# Patient Record
Sex: Male | Born: 1985 | Race: White | Hispanic: No | Marital: Single | Smoking: Current some day smoker
Health system: Southern US, Community
[De-identification: ages and names within clinical notes are randomized; demographics above are authoritative.]

## PROBLEM LIST (undated history)

## (undated) HISTORY — PX: KNEE ARTHROSCOPY: SHX127

---

## 2016-02-11 ENCOUNTER — Encounter: Payer: Self-pay | Admitting: Emergency Medicine

## 2016-02-11 ENCOUNTER — Emergency Department
Admission: EM | Admit: 2016-02-11 | Discharge: 2016-02-12 | Disposition: A | Discharge status: Home / Self Care | Payer: Self-pay | Attending: Emergency Medicine | Admitting: Emergency Medicine

## 2016-02-11 DIAGNOSIS — F4325 Adjustment disorder with mixed disturbance of emotions and conduct: Secondary | ICD-10-CM

## 2016-02-11 DIAGNOSIS — F1290 Cannabis use, unspecified, uncomplicated: Secondary | ICD-10-CM | POA: Insufficient documentation

## 2016-02-11 DIAGNOSIS — F17200 Nicotine dependence, unspecified, uncomplicated: Secondary | ICD-10-CM | POA: Insufficient documentation

## 2016-02-11 DIAGNOSIS — F4329 Adjustment disorder with other symptoms: Secondary | ICD-10-CM | POA: Insufficient documentation

## 2016-02-11 DIAGNOSIS — R45851 Suicidal ideations: Secondary | ICD-10-CM

## 2016-02-11 DIAGNOSIS — F1210 Cannabis abuse, uncomplicated: Secondary | ICD-10-CM

## 2016-02-11 DIAGNOSIS — Z5181 Encounter for therapeutic drug level monitoring: Secondary | ICD-10-CM | POA: Insufficient documentation

## 2016-02-11 DIAGNOSIS — F17290 Nicotine dependence, other tobacco product, uncomplicated: Secondary | ICD-10-CM | POA: Insufficient documentation

## 2016-02-11 DIAGNOSIS — Z046 Encounter for general psychiatric examination, requested by authority: Secondary | ICD-10-CM

## 2016-02-11 LAB — COMPREHENSIVE METABOLIC PANEL
ALT: 26 U/L (ref 17–63)
AST: 28 U/L (ref 15–41)
Albumin: 5 g/dL (ref 3.5–5.0)
Alkaline Phosphatase: 50 U/L (ref 38–126)
Anion gap: 12 (ref 5–15)
BILIRUBIN TOTAL: 0.8 mg/dL (ref 0.3–1.2)
BUN: 18 mg/dL (ref 6–20)
CHLORIDE: 109 mmol/L (ref 101–111)
CO2: 22 mmol/L (ref 22–32)
Calcium: 9.9 mg/dL (ref 8.9–10.3)
Creatinine, Ser: 0.98 mg/dL (ref 0.61–1.24)
GFR calc Af Amer: >60 mL/min (ref >60)
Glucose, Bld: 103 mg/dL — ABNORMAL HIGH (ref 65–99)
POTASSIUM: 3.6 mmol/L (ref 3.5–5.1)
Sodium: 143 mmol/L (ref 135–145)
TOTAL PROTEIN: 8 g/dL (ref 6.5–8.1)

## 2016-02-11 LAB — CBC
HCT: 47.6 % (ref 40.0–52.0)
Hemoglobin: 16.3 g/dL (ref 13.0–18.0)
MCH: 31.5 pg (ref 26.0–34.0)
MCHC: 34.4 g/dL (ref 32.0–36.0)
MCV: 91.5 fL (ref 80.0–100.0)
PLATELETS: 136 10*3/uL — AB (ref 150–440)
RBC: 5.2 MIL/uL (ref 4.40–5.90)
RDW: 12.8 % (ref 11.5–14.5)
WBC: 9.6 10*3/uL (ref 3.8–10.6)

## 2016-02-11 LAB — URINE DRUG SCREEN, QUALITATIVE (ARMC ONLY)
AMPHETAMINES, UR SCREEN: NOT DETECTED
BENZODIAZEPINE, UR SCRN: NOT DETECTED
Barbiturates, Ur Screen: NOT DETECTED
CANNABINOID 50 NG, UR ~~LOC~~: POSITIVE — AB
Cocaine Metabolite,Ur ~~LOC~~: NOT DETECTED
MDMA (ECSTASY) UR SCREEN: NOT DETECTED
Methadone Scn, Ur: NOT DETECTED
Opiate, Ur Screen: NOT DETECTED
PHENCYCLIDINE (PCP) UR S: NOT DETECTED
TRICYCLIC, UR SCREEN: NOT DETECTED

## 2016-02-11 LAB — ETHANOL

## 2016-02-11 LAB — SALICYLATE LEVEL

## 2016-02-11 LAB — ACETAMINOPHEN LEVEL: Acetaminophen (Tylenol), Serum: <10 ug/mL — ABNORMAL LOW (ref 10–30)

## 2016-02-11 MED ORDER — IBUPROFEN 100 MG/5ML PO SUSP: 600.0000 mg | Freq: Once | ORAL | Status: DC

## 2016-02-11 MED ORDER — IBUPROFEN 600 MG PO TABS
ORAL_TABLET | ORAL | Status: AC
  Administered 2016-02-11: 600 mg via ORAL
  Filled 2016-02-11: qty 1

## 2016-02-11 MED ORDER — DIPHENHYDRAMINE HCL 25 MG PO CAPS
50.0000 mg | ORAL_CAPSULE | Freq: Once | ORAL | Status: AC
  Administered 2016-02-11: 50 mg via ORAL
  Filled 2016-02-11: qty 2

## 2016-02-11 MED ORDER — IBUPROFEN 600 MG PO TABS
600.0000 mg | ORAL_TABLET | Freq: Once | ORAL | Status: AC
  Administered 2016-02-11: 600 mg via ORAL

## 2016-02-11 NOTE — ED Notes (Signed)
MD at bedside. 

## 2016-02-11 NOTE — ED Notes (Signed)
Pt moved to room 25 and SOC setup at bedside.

## 2016-02-11 NOTE — ED Provider Notes (Addendum)
[facility] Emergency Department Provider Note   ____________________________________________   I have reviewed the triage vital signs and the nursing notes.   HISTORY  Chief Complaint Psychiatric Evaluation   History limited by: Not Limited   HPI Fernando Phelps is a 30 y.o. male who presents to the emergency department under IVC paperwork. Per IVC paperwork the patient made comments concerning for suicidal ideation on social media. He did admit to this. He states he had a very hard day. He said that he wrote those to get some attention. He denies any current thoughts of SI or history of depression.   History reviewed. No pertinent past medical history.  There are no active problems to display for this patient.   History reviewed. No pertinent surgical history.  Prior to Admission medications   Not on File    Allergies Review of patient's allergies indicates no known allergies.  History reviewed. No pertinent family history.  Social History Social History  Substance Use Topics  . Smoking status: Current Some Day Smoker  . Smokeless tobacco: Current User  . Alcohol use Yes    Review of Systems  Constitutional: Negative for fever. Cardiovascular: Negative for chest pain. Respiratory: Negative for shortness of breath. Gastrointestinal: Negative for abdominal pain, vomiting and diarrhea. Neurological: Negative for headaches, focal weakness or numbness.  10-point ROS otherwise negative.  ____________________________________________   PHYSICAL EXAM:  VITAL SIGNS: ED Triage Vitals [02/11/16 1839]  Enc Vitals Group     BP (!) 136/96     Pulse Rate 80     Resp 18     Temp 98.9 F (37.2 C)     Temp Source Oral     SpO2 97 %     Weight 235 lb (106.6 kg)     Height  (1.93 m)   Constitutional: Alert and oriented. Well appearing and in no distress. Eyes: Conjunctivae are normal. PERRL. Normal extraocular  movements. ENT   Head: Normocephalic and atraumatic.   Nose: No congestion/rhinnorhea.   Mouth/Throat: Mucous membranes are moist.   Neck: No stridor. Hematological/Lymphatic/Immunilogical: No cervical lymphadenopathy. Cardiovascular: Normal rate, regular rhythm.  No murmurs, rubs, or gallops. Respiratory: Normal respiratory effort without tachypnea nor retractions. Breath sounds are clear and equal bilaterally. No wheezes/rales/rhonchi. Gastrointestinal: Soft and nontender. No distention. There is no CVA tenderness. Genitourinary: Deferred Musculoskeletal: Normal range of motion in all extremities. No joint effusions.  No lower extremity tenderness nor edema. Neurologic:  Normal speech and language. No gross focal neurologic deficits are appreciated.  Skin:  Skin is warm, dry and intact. No rash noted. Psychiatric: Mood and affect are normal. Speech and behavior are normal. Patient exhibits appropriate insight and judgment.  ____________________________________________    LABS (pertinent positives/negatives)  Labs Reviewed  COMPREHENSIVE METABOLIC PANEL - Abnormal; Notable for the following:       Result Value   Glucose, Bld 103 (*)    All other components within normal limits  ACETAMINOPHEN LEVEL - Abnormal; Notable for the following:    Acetaminophen (Tylenol), Serum <10 (*)    All other components within normal limits  CBC - Abnormal; Notable for the following:    Platelets 136 (*)    All other components within normal limits  URINE DRUG SCREEN, QUALITATIVE ([facility] ONLY) - Abnormal; Notable for the following:    Cannabinoid 50 Ng, Ur [place] POSITIVE (*)    All other components within normal limits  ETHANOL  SALICYLATE LEVEL     ____________________________________________  EKG  None  ____________________________________________     RADIOLOGY  None  ____________________________________________   PROCEDURES  Procedures  ____________________________________________   INITIAL IMPRESSION / ASSESSMENT AND PLAN / ED COURSE  Pertinent labs & imaging results that were available during my care of the patient were reviewed by me and considered in my medical decision making (see chart for details).  Patient presented to the emergency department today under IVC. Patient did make some concerning comments on social media. Did say he had a bad day today. He did deny any current SI to myself. However given concerning comments that he made will have patient be seen by specialist on-call psychiatrist  Clinical Course   [facility] evaluated patient and recommend inpatient admission. Discussed this with patient. Do not think patient requires one to one sitter at this point. q15 minute checks sufficient.  ____________________________________________   FINAL CLINICAL IMPRESSION(S) / ED DIAGNOSES  Suicidal ideation  Note: This dictation was prepared with Dragon dictation. Any transcriptional errors that result from this process are unintentional    Phineas Semen, MD 02/11/16 6789    Phineas Semen, MD 02/11/16 [phone]

## 2016-02-11 NOTE — ED Triage Notes (Signed)
PT arrived with sheriff. Pt IVC for threatening suicide. Made threat on facebook.  Pt states " I told people if they find me don't bury me find me somewhere beautiful".  Pt states "not really, I was upset, frustrated, and angry" when asked if he plans on hurting himself. Pt walked and found a church and a pastor approached him because he looked upset and talked with him for 2 hours.  At this time denies SI/HI and hallucinations.  Pt reports he was just upset at the situation he put himself in earlier.

## 2016-02-12 DIAGNOSIS — F4325 Adjustment disorder with mixed disturbance of emotions and conduct: Secondary | ICD-10-CM

## 2016-02-12 DIAGNOSIS — Z046 Encounter for general psychiatric examination, requested by authority: Secondary | ICD-10-CM

## 2016-02-12 DIAGNOSIS — R45851 Suicidal ideations: Secondary | ICD-10-CM

## 2016-02-12 DIAGNOSIS — F1210 Cannabis abuse, uncomplicated: Secondary | ICD-10-CM

## 2016-02-12 MED ORDER — NICOTINE 14 MG/24HR TD PT24
MEDICATED_PATCH | TRANSDERMAL | Status: AC
  Administered 2016-02-12: 14 mg via TRANSDERMAL
  Filled 2016-02-12: qty 1

## 2016-02-12 MED ORDER — NICOTINE 14 MG/24HR TD PT24
14.0000 mg | MEDICATED_PATCH | Freq: Every day | TRANSDERMAL | Status: DC
  Administered 2016-02-12 (×2): 14 mg via TRANSDERMAL
  Filled 2016-02-12: qty 1

## 2016-02-12 NOTE — ED Notes (Signed)
BEHAVIORAL HEALTH ROUNDING Patient sleeping: Yes.   Patient alert and oriented: not applicable SLEEPING Behavior appropriate: Yes.  ; If no, describe: SLEEPING Nutrition and fluids offered: No SLEEPING Toileting and hygiene offered: NoSLEEPING Sitter present: not applicable, Q 15 min safety rounds and observation via security camera. Law enforcement present: Yes ODS 

## 2016-02-12 NOTE — ED Notes (Signed)

## 2016-02-12 NOTE — Consult Note (Signed)
[facility] Consult   Reason for Consult:  Consult for 30 year old man with no identified past psychiatric history brought in under involuntary commitment Referring Physician:  Edd Fabian Patient Identification: Fernando Phelps MRN:  [id number] Principal Diagnosis: Adjustment disorder with mixed disturbance of emotions and conduct Diagnosis:   Patient Active Problem List   Diagnosis Date Noted  . Adjustment disorder with mixed disturbance of emotions and conduct [F43.25] 02/12/2016  . Suicidal ideation [R45.851] 02/12/2016  . Involuntary commitment [Z04.6] 02/12/2016  . Marijuana abuse [F12.10] 02/12/2016    Total Time spent with patient: 1 hour  Subjective:   Densil Ottey is a 30 y.o. male patient admitted with "I said something stupid that I shouldn't upset".  HPI:  Patient interviewed. Chart reviewed. Labs and vitals reviewed. 30 year old man brought here under involuntary commitment. Patient was involved with a verbal altercation with his fiance yesterday that was the result of some recent infidelity on his part. This happened early afternoon at his home. After his fianc had gone to work the patient says that he continued to feel very bad about himself. He admits that he posted on Facebook a message that implied that he might kill himself. It said something along the lines of how he was going to say goodbye to this world etc. Patient says that at that time he had no actual intention or plan of harming himself. After posting this he says he went for a long walk. Along the way his cell phone died. At some point later in the afternoon police found him walking and had him brought into the hospital because by that time they had been called by his fianc. Patient says that he was just feeling very upset yesterday. He admits that he was involved in a stupid situation where he cheated on his fianc by text message. He is afraid that his fianc might permanently be breaking  up with him. He says he was feeling very bad about himself and he thinks he was just looking for sympathy. He says prior to yesterday his mood had been good. There is no report of any recent depression. Sleep and appetite had been fine. Patient completely denies them being any thought of suicide at any point. Denies any homicidal ideation. Denies any psychotic symptoms. Recent stress includes the obvious problem with his fianc and also the fact that he is currently out of work. 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 He does not receive any kind of mental health or psychiatric care. Patient says he drinks very little and had not been drinking yesterday. He denied other drug abuse although his drug screen is positive for cannabis.  Social history: Patient lives with his fiance and his own 31-year-old daughter. The child's mother lives back in [place] state. 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 He and his fiance had been together for years he is afraid that she may break up with him now.  Medical history: Denies any ongoing medical problems. No history of high blood pressure diabetes or other significant medical problems.  Substance abuse history: He states that he tends to avoid alcohol because of a family history of alcohol abuse. Was not drinking yesterday. He denied substance use to me although his drug screen is positive for marijuana.  Past Psychiatric History: Denies any past psychiatric treatment as an adult. He says that he was treated with Ritalin as a child but has taken no  psychiatric medicines as an adult. Never been in a psychiatric hospital. Denies any history of suicide attempts or violence.  Risk to Self: Suicidal Ideation: No Suicidal Intent: No Is patient at risk for suicide?: No Suicidal Plan?: No Access to Means: No What has been your use of drugs/alcohol within the last 12 months?: Cannabis & Alcohol How many times?:  0 Other Self Harm Risks: Reports of none Triggers for Past Attempts: None known Intentional Self Injurious Behavior: None Risk to Others: Homicidal Ideation: No Thoughts of Harm to Others: No Current Homicidal Intent: No Current Homicidal Plan: No Access to Homicidal Means: No Identified Victim: Reports of none History of harm to others?: No Assessment of Violence: None Noted Violent Behavior Description: Reports of none Does patient have access to weapons?: No Criminal Charges Pending?: No Does patient have a court date: No Prior Inpatient Therapy: Prior Inpatient Therapy: No Prior Therapy Dates: Reports of none Prior Therapy Facilty/Provider(s): Reports of none Reason for Treatment: Reports of none Prior Outpatient Therapy: Prior Outpatient Therapy: No Prior Therapy Dates: Reports of none Prior Therapy Facilty/Provider(s): Reports of none Reason for Treatment: Reports of none Does patient have an ACCT team?: No Does patient have Intensive In-House Services?  : No Does patient have [facility] services? : No Does patient have P4CC services?: No  Past Medical History: History reviewed. No pertinent past medical history. History reviewed. No pertinent surgical history. Family History: History reviewed. No pertinent family history. Family Psychiatric  History: He reports that his father and several other members of his family have substance abuse problems but he denies knowing of any other mental health problems in the family. Social History:  History  Alcohol Use  . Yes     History  Drug Use  . Types: Marijuana    Social History   Social History  . Marital status: Single    Spouse name: N/A  . Number of children: N/A  . Years of education: N/A   Social History Main Topics  . Smoking status: Current Some Day Smoker  . Smokeless tobacco: Current User  . Alcohol use Yes  . Drug use:     Types: Marijuana  . Sexual activity: Not Asked   Other Topics Concern  . None    Social History Narrative  . None   Additional Social History:    Allergies:  No Known Allergies  Labs:  Results for orders placed or performed during the hospital encounter of 02/11/16 (from the past 48 hour(s))  Comprehensive metabolic panel     Status: Abnormal   Collection Time: 02/11/16  6:43 PM  Result Value Ref Range   Sodium 143 135 - 145 mmol/L   Potassium 3.6 3.5 - 5.1 mmol/L   Chloride 109 101 - 111 mmol/L   CO2 22 22 - 32 mmol/L   Glucose, Bld 103 (H) 65 - 99 mg/dL   BUN 18 6 - 20 mg/dL   Creatinine, Ser 0.98 0.61 - 1.24 mg/dL   Calcium 9.9 8.9 - 10.3 mg/dL   Total Protein 8.0 6.5 - 8.1 g/dL   Albumin 5.0 3.5 - 5.0 g/dL   AST 28 15 - 41 U/L   ALT 26 17 - 63 U/L   Alkaline Phosphatase 50 38 - 126 U/L   Total Bilirubin 0.8 0.3 - 1.2 mg/dL   GFR calc non Af Amer >60 >60 mL/min   GFR calc Af Amer >60 >60 mL/min    Comment: (NOTE) The eGFR has been calculated using the  CKD EPI equation. This calculation has not been validated in all clinical situations. eGFR's persistently <60 mL/min signify possible Chronic Kidney Disease.    Anion gap 12 5 - 15  Ethanol     Status: None   Collection Time: 02/11/16  6:43 PM  Result Value Ref Range   Alcohol, Ethyl (B) <5 <5 mg/dL    Comment:        LOWEST DETECTABLE LIMIT FOR SERUM ALCOHOL IS 5 mg/dL FOR MEDICAL PURPOSES ONLY   Salicylate level     Status: None   Collection Time: 02/11/16  6:43 PM  Result Value Ref Range   Salicylate Lvl <4.4 2.8 - 30.0 mg/dL  Acetaminophen level     Status: Abnormal   Collection Time: 02/11/16  6:43 PM  Result Value Ref Range   Acetaminophen (Tylenol), Serum <10 (L) 10 - 30 ug/mL    Comment:        THERAPEUTIC CONCENTRATIONS VARY SIGNIFICANTLY. A RANGE OF 10-30 ug/mL MAY BE AN EFFECTIVE CONCENTRATION FOR MANY PATIENTS. HOWEVER, SOME ARE BEST TREATED AT CONCENTRATIONS OUTSIDE THIS RANGE. ACETAMINOPHEN CONCENTRATIONS >150 ug/mL AT 4 HOURS AFTER INGESTION AND >50 ug/mL AT  12 HOURS AFTER INGESTION ARE OFTEN ASSOCIATED WITH TOXIC REACTIONS.   cbc     Status: Abnormal   Collection Time: 02/11/16  6:43 PM  Result Value Ref Range   WBC 9.6 3.8 - 10.6 K/uL   RBC 5.20 4.40 - 5.90 MIL/uL   Hemoglobin 16.3 13.0 - 18.0 g/dL   HCT 47.6 40.0 - 52.0 %   MCV 91.5 80.0 - 100.0 fL   MCH 31.5 26.0 - 34.0 pg   MCHC 34.4 32.0 - 36.0 g/dL   RDW 12.8 11.5 - 14.5 %   Platelets 136 (L) 150 - 440 K/uL  Urine Drug Screen, Qualitative     Status: Abnormal   Collection Time: 02/11/16  7:19 PM  Result Value Ref Range   Tricyclic, Ur Screen NONE DETECTED NONE DETECTED   Amphetamines, Ur Screen NONE DETECTED NONE DETECTED   MDMA (Ecstasy)Ur Screen NONE DETECTED NONE DETECTED   Cocaine Metabolite,Ur [place] NONE DETECTED NONE DETECTED   Opiate, Ur Screen NONE DETECTED NONE DETECTED   Phencyclidine (PCP) Ur S NONE DETECTED NONE DETECTED   Cannabinoid 50 Ng, Ur [place] POSITIVE (A) NONE DETECTED   Barbiturates, Ur Screen NONE DETECTED NONE DETECTED   Benzodiazepine, Ur Scrn NONE DETECTED NONE DETECTED   Methadone Scn, Ur NONE DETECTED NONE DETECTED    Comment: (NOTE) 315  Tricyclics, urine               Cutoff 1000 ng/mL 200  Amphetamines, urine             Cutoff 1000 ng/mL 300  MDMA (Ecstasy), urine           Cutoff 500 ng/mL 400  Cocaine Metabolite, urine       Cutoff 300 ng/mL 500  Opiate, urine                   Cutoff 300 ng/mL 600  Phencyclidine (PCP), urine      Cutoff 25 ng/mL 700  Cannabinoid, urine              Cutoff 50 ng/mL 800  Barbiturates, urine             Cutoff 200 ng/mL 900  Benzodiazepine, urine           Cutoff 200 ng/mL 1000 Methadone, urine  Cutoff 300 ng/mL 1100 1200 The urine drug screen provides only a preliminary, unconfirmed 1300 analytical test result and should not be used for non-medical 1400 purposes. Clinical consideration and professional judgment should 1500 be applied to any positive drug screen result due to possible 1600  interfering substances. A more specific alternate chemical method 1700 must be used in order to obtain a confirmed analytical result.  1800 Gas chromato graphy / mass spectrometry (GC/MS) is the preferred 1900 confirmatory method.     Current Facility-Administered Medications  Medication Dose Route Frequency Provider Last Rate Last Dose  . nicotine (NICODERM CQ - dosed in mg/24 hours) patch 14 mg  14 mg Transdermal Daily Paulette Blanch, MD   14 mg at 02/12/16 9767   No current outpatient prescriptions on file.    Musculoskeletal: Strength & Muscle Tone: within normal limits Gait & Station: normal Patient leans: N/A  Psychiatric Specialty Exam: Physical Exam  Nursing note and vitals reviewed. Constitutional: He appears well-developed and well-nourished.  HENT:  Head: Normocephalic and atraumatic.  Eyes: Conjunctivae are normal. Pupils are equal, round, and reactive to light.  Neck: Normal range of motion.  Cardiovascular: Regular rhythm and normal heart sounds.   Respiratory: Effort normal. No respiratory distress.  GI: Soft.  Musculoskeletal: Normal range of motion.  Neurological: He is alert.  Skin: Skin is warm and dry.  Psychiatric: He has a normal mood and affect. His behavior is normal. Judgment and thought content normal.    Review of Systems  Constitutional: Negative.   HENT: Negative.   Eyes: Negative.   Respiratory: Negative.   Cardiovascular: Negative.   Gastrointestinal: Negative.   Musculoskeletal: Negative.   Skin: Negative.   Neurological: Negative.   Psychiatric/Behavioral: Negative for depression, hallucinations, memory loss, substance abuse and suicidal ideas. The patient is nervous/anxious. The patient does not have insomnia.     Blood pressure (!) 136/96, pulse 80, temperature 98.9 F (37.2 C), temperature source Oral, resp. rate 18, height 6' 4"  (1.93 m), weight 106.6 kg (235 lb), SpO2 97 %.Body mass index is 28.61 kg/m.  General Appearance: Casual   Eye Contact:  Good  Speech:  Clear and Coherent  Volume:  Normal  Mood:  Euthymic  Affect:  Appropriate  Thought Process:  Coherent  Orientation:  Full (Time, Place, and Person)  Thought Content:  Logical  Suicidal Thoughts:  No  Homicidal Thoughts:  No  Memory:  Immediate;   Good Recent;   Good Remote;   Good  Judgement:  Fair  Insight:  Fair  Psychomotor Activity:  Normal  Concentration:  Concentration: Fair  Recall:  [employer] of Knowledge:  Fair  Language:  Fair  Akathisia:  No  Handed:  Right  AIMS (if indicated):     Assets:  Communication Skills Desire for Improvement Physical Health Resilience  ADL's:  Intact  Cognition:  WNL  Sleep:        Treatment Plan Summary: Plan 30 year old man brought in under commitment because of an impulsive suicidal statement. No evidence that he had acted on it. Patient is denying any symptoms of depression. Does not appear to be psychotic. He shows appropriate regret for his situation. He has positive plans for the future. Does not appear to meet commitment criteria or require inpatient psychiatric treatment. Counseling has been done with the patient and he has been encouraged to consider going to therapy if mood problems persist. Discontinue involuntary commitment. Patient can be released from the emergency room. Case discussed  with emergency room physician and TTS.  Disposition: Patient does not meet criteria for psychiatric inpatient admission. Supportive therapy provided about ongoing stressors.  Alethia Berthold, MD 02/12/2016 11:47 AM

## 2016-02-12 NOTE — ED Notes (Signed)
Patient continues to endorse frustration with confinement to  ED. He is asking several staff members about when he will see MD so he can go home. He was reminded that staff did not have control over physician schedule; and need to be in the ED was a result of his expressing SI over a social media site.  Maintained on 15 minute checks and observation by security camera for safety.

## 2016-02-12 NOTE — ED Notes (Signed)
ENVIRONMENTAL ASSESSMENT  Potentially harmful objects out of patient reach: Yes.  Personal belongings secured: Yes.  Patient dressed in hospital provided attire only: Yes.  Plastic bags out of patient reach: Yes.  Patient care equipment (cords, cables, call bells, lines, and drains) shortened, removed, or accounted for: Yes.  Equipment and supplies removed from bottom of stretcher: Yes.  Potentially toxic materials out of patient reach: Yes.  Sharps container removed or out of patient reach: Yes.   BEHAVIORAL HEALTH ROUNDING  Patient sleeping: No.  Patient alert and oriented: yes  Behavior appropriate: Yes. ; If no, describe:  Nutrition and fluids offered: Yes  Toileting and hygiene offered: Yes  Sitter present: not applicable, Q 15 min safety rounds and observation via security camera. Law enforcement present: Yes ODS  Pt brought into ED BHU via sally port and wand with metal detector for safety by ODS officer. Patient oriented to unit/care area: Pt informed of unit policies and procedures.  Informed that, for their safety, care areas are designed for safety and monitored by security cameras at all times; and visiting hours explained to patient. Patient verbalizes understanding. Pt shown to their room.

## 2016-02-12 NOTE — ED Notes (Signed)
Patient awake, alert, and oriented. He denies SI or HI. Patient is frustrated with length of stay in the ED. He states he is safe to be discharged.  RN educated patient about need to have full psychiatric assessment in order to have IVC rescinded and discharge. Maintained on 15 minute checks and observation by security camera for safety.

## 2016-02-12 NOTE — ED Provider Notes (Signed)
-----------------------------------------   6:09 AM on 02/12/2016 -----------------------------------------   Blood pressure (!) 136/96, pulse 80, temperature 98.9 F (37.2 C), temperature source Oral, resp. rate 18, height 6\' 4"  (1.93 m), weight 235 lb (106.6 kg), SpO2 97 %.  The patient had no acute events since last update.  Calm and cooperative at this time.  Disposition is pending per Psychiatry/Behavioral Medicine team recommendations.     Irean Hong, MD 02/12/16 [phone]

## 2016-02-12 NOTE — ED Provider Notes (Signed)
-----------------------------------------   1:03 PM on 02/12/2016 -----------------------------------------  Dr. Toni Amend has evaluated the patient, rescinded IVC, recommends discharge with outpatient resources. DC home with return precautions.   Gayla Doss, MD 02/12/16 1304

## 2016-02-12 NOTE — ED Notes (Signed)
Patient in dayroom. Discharge disposition in progress.Maintained on 15 minute checks and observation by security camera for safety.

## 2016-02-12 NOTE — ED Notes (Signed)
Patient meeting with the psychiatrist. 

## 2016-02-12 NOTE — ED Notes (Signed)
Patient discharged ambulatory to self. He denies SI or HI. Discharge instructions reviewed with patient, he verbalizes understanding. Patient received all personal belongings. 

## 2016-02-12 NOTE — BH Assessment (Signed)
Assessment Note  Fernando Phelps is an 30 y.o. male who presents to the ER, via Law Enforcement due to a post he put on FaceBook, which suggest he was going to end his life. Patient denies past and current SI, gestures an attempts. He states he put the post on FaceBook because he was frustrated an overwhelmed. He used his phone to put it up there and had only "2 to 3 percent of his battery left." It resulted in no one being able to contact him for approximately two hours.  Per the report of the patient, he and his fianc are having problems in their relationship. Patient admits to sleeping with another woman, this past March. He was in [facility], for [employer] for his daughter. While there the event took place. Another lady wanted to sleep with him and he turned her down. The lady made statements about his genitals. He responded by sending a picture of his penis. On today, the young lady, who he didn't sleep with, forwarded the picture to his fianc an informed her he slept with someone else. Patient had went back to [facility] again for court. When he was boarding the plane to return back to [facility], fianc called him and confront him about it.  When he landed in [place], the argument continued. She voiced her frustration with him and stated the relationship was over. She also told him, he had to get out. Patient is living with the fianc and her father. Patient also have his daughter with him and she's living in the same home. This is what led to him being frustrated an overwhelmed.  He states the post was "more like poetry. I wasn't going to kill myself. But I guess it didn't help, I went missing for two hours." Per his report, the post was about how life was a boulder and rolling down a hill. Trees stop it every now and again but it keep rolling. When it gets to the bottom, "don't burry my body but spread my ashes everywhere." After sending the post, he "went walking  in the woods to clear my head." He ended up at a nearby church. He talked with the pastor for approximately two hours. During that time his family and friends were looking for him. After talking with the pastor, he stopped by [employer] and got something to eat. While walking home, law enforcement seen him an asked him to get in the car, because papers were filled for him to be committed.  During the time, the patient been in the ER, he has denied SI/HI and AV/H. Outside of the custody case with his daughter, he have no involvement with the legal system. During the interview he was calm, pleasant and cooperative. He voice his frustration about being in the ER, because he's afraid he is going to miss work. "I lost three jobs because I had to fly back and forwarded to [place]." The mother of the patient's daughter is abusing drugs and he's working on getting full custody of the child. The child is currently with the maternal grandmother. She moved to [place] to be close to the granddaughter. Patient and his daughter been moved to [facility] January 2017.   Past Medical History: History reviewed. No pertinent past medical history.  History reviewed. No pertinent surgical history.  Family History: History reviewed. No pertinent family history.  Social History:  reports that he has been smoking.  He uses smokeless tobacco. He reports  that he drinks alcohol. He reports that he uses drugs, including Marijuana.  Additional Social History:  Alcohol / Drug Use Pain Medications: See PTA Prescriptions: See PTA Over the Counter: See PTA  History of alcohol / drug use?: Yes Longest period of sobriety (when/how long): Reports of none Negative Consequences of Use:  (Reports of none) Withdrawal Symptoms:  (Reports of none) Substance #1 Name of Substance 1: Alcohol 1 - Frequency: 2 to 3 days a week 1 - Last Use / Amount: Unknown Substance #2 Name of Substance 2: Cannabis 2 - Frequency: 3 to 4 days a  week 2 - Last Use / Amount: Unknown  CIWA: CIWA-Ar BP: (!) 136/96 Pulse Rate: 80 COWS:    Allergies: No Known Allergies  Home Medications:  (Not in a hospital admission)  OB/GYN Status:  No LMP for male patient.  General Assessment Data Location of Assessment: [facility] ED TTS Assessment: In system Is this a Tele or Face-to-Face Assessment?: Face-to-Face Is this an Initial Assessment or a Re-assessment for this encounter?: Initial Assessment Marital status: Long term relationship Maiden name: n/a Is patient pregnant?: No Pregnancy Status: No Living Arrangements: Spouse/significant other Can pt return to current living arrangement?: No Admission Status: Involuntary Is patient capable of signing voluntary admission?: No Referral Source: Self/Family/Friend Insurance type: n/a  Medical Screening Exam [facility] Walk-in ONLY) Medical Exam completed: Yes  Crisis Care Plan Living Arrangements: Spouse/significant other Legal Guardian:  (Reports of none) Name of Psychiatrist: Reports of none Name of Therapist: Reports of none  Education Status Is patient currently in school?: No Current Grade: n/a Highest grade of school patient has completed: [employer]ocational School Name of school: n/a Contact person: n/a  Risk to self with the past 6 months Suicidal Ideation: No Has patient been a risk to self within the past 6 months prior to admission? : No Suicidal Intent: No Has patient had any suicidal intent within the past 6 months prior to admission? : No Is patient at risk for suicide?: No Suicidal Plan?: No Has patient had any suicidal plan within the past 6 months prior to admission? : No Access to Means: No What has been your use of drugs/alcohol within the last 12 months?: Cannabis & Alcohol Previous Attempts/Gestures: No How many times?: 0 Other Self Harm Risks: Reports of none Triggers for Past Attempts: None known Intentional Self Injurious Behavior: None Family Suicide History:  No Recent stressful life event(s):  (Reports of none) Persecutory voices/beliefs?: No Depression: Yes Depression Symptoms: Guilt Substance abuse history and/or treatment for substance abuse?: Yes Suicide prevention information given to non-admitted patients: Not applicable  Risk to Others within the past 6 months Homicidal Ideation: No Does patient have any lifetime risk of violence toward others beyond the six months prior to admission? : No Thoughts of Harm to Others: No Current Homicidal Intent: No Current Homicidal Plan: No Access to Homicidal Means: No Identified Victim: Reports of none History of harm to others?: No Assessment of Violence: None Noted Violent Behavior Description: Reports of none Does patient have access to weapons?: No Criminal Charges Pending?: No Does patient have a court date: No Is patient on probation?: No  Psychosis Hallucinations: None noted Delusions: None noted  Mental Status Report Appearance/Hygiene: In hospital gown, In scrubs, Unremarkable Eye Contact: Good Motor Activity: Freedom of movement, Unremarkable Speech: Logical/coherent, Unremarkable Level of Consciousness: Alert Mood: Anxious, Pleasant Affect: Appropriate to circumstance Anxiety Level: Minimal Thought Processes: Coherent, Relevant Judgement: Unimpaired Orientation: Person, Place, Time, Situation, Appropriate for developmental  age Obsessive Compulsive Thoughts/Behaviors: Minimal  Cognitive Functioning Concentration: Normal Memory: Recent Intact, Remote Intact IQ: Average Insight: Fair Impulse Control: Fair Appetite: Good Weight Loss: 0 Weight Gain: 0 Sleep: No Change Total Hours of Sleep: 8 Vegetative Symptoms: None  ADLScreening [facility] Assessment Services) Patient's cognitive ability adequate to safely complete daily activities?: Yes Patient able to express need for assistance with ADLs?: Yes Independently performs ADLs?: Yes (appropriate for developmental  age)  Prior Inpatient Therapy Prior Inpatient Therapy: No Prior Therapy Dates: Reports of none Prior Therapy Facilty/Provider(s): Reports of none Reason for Treatment: Reports of none  Prior Outpatient Therapy Prior Outpatient Therapy: No Prior Therapy Dates: Reports of none Prior Therapy Facilty/Provider(s): Reports of none Reason for Treatment: Reports of none Does patient have an ACCT team?: No Does patient have Intensive In-House Services?  : No Does patient have [facility] services? : No Does patient have P4CC services?: No  ADL Screening (condition at time of admission) Patient's cognitive ability adequate to safely complete daily activities?: Yes Is the patient deaf or have difficulty hearing?: No Does the patient have difficulty seeing, even when wearing glasses/contacts?: No Does the patient have difficulty concentrating, remembering, or making decisions?: No Patient able to express need for assistance with ADLs?: Yes Does the patient have difficulty dressing or bathing?: No Independently performs ADLs?: Yes (appropriate for developmental age) Does the patient have difficulty walking or climbing stairs?: No Weakness of Legs: None Weakness of Arms/Hands: None  Home Assistive Devices/Equipment Home Assistive Devices/Equipment: None  Therapy Consults (therapy consults require a physician order) PT Evaluation Needed: No OT Evalulation Needed: No SLP Evaluation Needed: No Abuse/Neglect Assessment (Assessment to be complete while patient is alone) Physical Abuse: Denies Verbal Abuse: Denies Sexual Abuse: Denies Exploitation of patient/patient's resources: Denies Self-Neglect: Denies Values / Beliefs Cultural Requests During Hospitalization: None Spiritual Requests During Hospitalization: None Consults Spiritual Care Consult Needed: No Social Work Consult Needed: No      Additional Information 1:1 In Past 12 Months?: No CIRT Risk: No Elopement Risk: No Does  patient have medical clearance?: Yes  Child/Adolescent Assessment Running Away Risk: Denies (Patient is an adult)  Disposition:  Disposition Initial Assessment Completed for this Encounter: Yes Disposition of Patient: Other dispositions (ER MD ordered Psych Consult)  On Site Evaluation by:   Reviewed with Physician:    Lilyan Gilford MS, LCAS, LPC, NCC, CCSI Therapeutic Triage Specialist 02/12/2016 2:23 AM

## 2016-04-03 ENCOUNTER — Encounter: Payer: Self-pay | Admitting: Emergency Medicine

## 2016-04-03 ENCOUNTER — Emergency Department
Admission: EM | Admit: 2016-04-03 | Discharge: 2016-04-03 | Disposition: A | Discharge status: Home / Self Care | Payer: Self-pay | Attending: Emergency Medicine | Admitting: Emergency Medicine

## 2016-04-03 DIAGNOSIS — T505X4A Poisoning by appetite depressants, undetermined, initial encounter: Secondary | ICD-10-CM | POA: Insufficient documentation

## 2016-04-03 DIAGNOSIS — F17210 Nicotine dependence, cigarettes, uncomplicated: Secondary | ICD-10-CM | POA: Insufficient documentation

## 2016-04-03 DIAGNOSIS — Z5181 Encounter for therapeutic drug level monitoring: Secondary | ICD-10-CM | POA: Insufficient documentation

## 2016-04-03 DIAGNOSIS — T50904A Poisoning by unspecified drugs, medicaments and biological substances, undetermined, initial encounter: Secondary | ICD-10-CM

## 2016-04-03 LAB — BASIC METABOLIC PANEL
ANION GAP: 9 (ref 5–15)
BUN: 15 mg/dL (ref 6–20)
CALCIUM: 8.9 mg/dL (ref 8.9–10.3)
CO2: 20 mmol/L — ABNORMAL LOW (ref 22–32)
Chloride: 110 mmol/L (ref 101–111)
Creatinine, Ser: 0.95 mg/dL (ref 0.61–1.24)
GFR calc Af Amer: >60 mL/min (ref >60)
GLUCOSE: 127 mg/dL — AB (ref 65–99)
Potassium: 3.4 mmol/L — ABNORMAL LOW (ref 3.5–5.1)
SODIUM: 139 mmol/L (ref 135–145)

## 2016-04-03 LAB — CBC
HCT: 47.9 % (ref 40.0–52.0)
Hemoglobin: 16.9 g/dL (ref 13.0–18.0)
MCH: 32.7 pg (ref 26.0–34.0)
MCHC: 35.3 g/dL (ref 32.0–36.0)
MCV: 92.7 fL (ref 80.0–100.0)
PLATELETS: 112 10*3/uL — AB (ref 150–440)
RBC: 5.17 MIL/uL (ref 4.40–5.90)
RDW: 12.7 % (ref 11.5–14.5)
WBC: 9.2 10*3/uL (ref 3.8–10.6)

## 2016-04-03 LAB — URINE DRUG SCREEN, QUALITATIVE (ARMC ONLY)
Amphetamines, Ur Screen: NOT DETECTED
BARBITURATES, UR SCREEN: NOT DETECTED
BENZODIAZEPINE, UR SCRN: NOT DETECTED
CANNABINOID 50 NG, UR ~~LOC~~: NOT DETECTED
COCAINE METABOLITE, UR ~~LOC~~: NOT DETECTED
MDMA (Ecstasy)Ur Screen: NOT DETECTED
Methadone Scn, Ur: NOT DETECTED
Opiate, Ur Screen: NOT DETECTED
Phencyclidine (PCP) Ur S: NOT DETECTED
TRICYCLIC, UR SCREEN: NOT DETECTED

## 2016-04-03 LAB — TROPONIN I

## 2016-04-03 MED ORDER — LORAZEPAM 2 MG/ML IJ SOLN
1.0000 mg | Freq: Once | INTRAMUSCULAR | Status: AC
  Administered 2016-04-03: 1 mg via INTRAVENOUS
  Filled 2016-04-03: qty 1

## 2016-04-03 MED ORDER — LORAZEPAM 1 MG PO TABS: 1.0000 mg | ORAL_TABLET | Freq: Three times a day (TID) | ORAL | 0 refills | Status: AC | PRN

## 2016-04-03 MED ORDER — LORAZEPAM 1 MG PO TABS
1.0000 mg | ORAL_TABLET | Freq: Once | ORAL | Status: AC
  Administered 2016-04-03: 1 mg via ORAL
  Filled 2016-04-03: qty 1

## 2016-04-03 MED ORDER — SODIUM CHLORIDE 0.9 % IV BOLUS (SEPSIS)
1000.0000 mL | Freq: Once | INTRAVENOUS | Status: AC
  Administered 2016-04-03: 1000 mL via INTRAVENOUS

## 2016-04-03 NOTE — ED Provider Notes (Signed)
Time Seen: Approximately *0422  I have reviewed the triage notes  Chief Complaint: Drug Overdose   History of Present Illness: Fernando Phelps is a 30 y.o. male who states that he took some medicine phentermine tonight approximately half a gram. The patient states that this was different men phentermine nannies had before from his own supply was given to him by a friend's girlfriend. Denies any coingestions. Patient states he felt like his heart was beating fast and his muscles were tense. He describes a sensation of her he feels like his heart "" drops "". He denies any suicidal thoughts or any homicidal thoughts or hallucinations.   History reviewed. No pertinent past medical history.  Patient Active Problem List   Diagnosis Date Noted  . 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 Suicidal ideation 02/12/2016  . Involuntary commitment 02/12/2016  . Marijuana abuse 02/12/2016    Past Surgical History:  Procedure Laterality Date  . KNEE ARTHROSCOPY Left     Past Surgical History:  Procedure Laterality Date  . KNEE ARTHROSCOPY Left     Current Outpatient Rx  . Order #: [id number] Class: Print    Allergies:  Review of patient's allergies indicates no known allergies.  Family History: History reviewed. No pertinent family history.  Social History: Social History  Substance Use Topics  . Smoking status: Current Some Day Smoker    Packs/day: 0.50    Types: Cigarettes  . Smokeless tobacco: Current User  . Alcohol use Yes     Review of Systems:   10 point review of systems was performed and was otherwise negative:  Constitutional: No fever Eyes: No visual disturbances ENT: No sore throat, ear pain Cardiac: No chest pain Respiratory: No shortness of breath, wheezing, or stridor Abdomen: No abdominal pain, no vomiting, No diarrhea Endocrine: No weight loss, No night sweats Extremities: No peripheral edema,  cyanosis Skin: No rashes, easy bruising Neurologic: No focal weakness, trouble with speech or swollowing Urologic: No dysuria, Hematuria, or urinary frequency   Physical Exam:  ED Triage Vitals  Enc Vitals Group     BP 04/03/16 0422 (!) 158/99     Pulse Rate 04/03/16 0422 (!) 104     Resp 04/03/16 0422 12     Temp 04/03/16 0422 98.9 F (37.2 C)     Temp Source 04/03/16 0422 Oral     SpO2 04/03/16 0422 100 %     Weight 04/03/16 0423 220 lb (99.8 kg)     Height 04/03/16 0423 6\' 4"  (1.93 m)     Head Circumference --      Peak Flow --      Pain Score --      Pain Loc --      Pain Edu? --      Excl. in GC? --     General: Awake , Alert , and Oriented times 3; GCS 15Patient's anxious Head: Normal cephalic , atraumatic Eyes: Pupils equal , round, reactive to light Nose/Throat: No nasal drainage, patent upper airway without erythema or exudate.  Neck: Supple, Full range of motion, No anterior adenopathy or palpable thyroid masses Lungs: Clear to ascultation without wheezes , rhonchi, or rales Heart: Tachycardic without murmurs gallops or rubs Abdomen: Soft, non tender without rebound, guarding , or rigidity; bowel sounds positive and symmetric in all 4 quadrants. No organomegaly .        Extremities: 2 plus symmetric pulses. No edema, clubbing or cyanosis Neurologic: normal ambulation,  Motor symmetric without deficits, sensory intact Skin: warm, dry, no rashes   Labs:   All laboratory work was reviewed including any pertinent negatives or positives listed below:  Labs Reviewed  CBC - Abnormal; Notable for the following:       Result Value   Platelets 112 (*)    All other components within normal limits  BASIC METABOLIC PANEL - Abnormal; Notable for the following:    Potassium 3.4 (*)    CO2 20 (*)    Glucose, Bld 127 (*)    All other components within normal limits  URINE DRUG SCREEN, QUALITATIVE ([facility] ONLY)  TROPONIN I  Laboratory work was reviewed and showed no  clinically significant abnormalities.   EKG:  ED ECG REPORT I, Jennye MoccasinBrian S Avary Pitsenbarger, the attending physician, personally viewed and interpreted this ECG.  Date: 04/03/2016 EKG Time: 0420 Rate: 98 Rhythm: normal sinus rhythm QRS Axis: normal Intervals: normal ST/T Wave abnormalities: normal Conduction Disturbances: none Narrative Interpretation: unremarkable    ED Course: Patient was observed here in emergency department and his heart rate has decreased though at times will go up to 120 bpm with a sinus tachycardia. I felt this was unlikely to be ischemic in nature. Patient does not appear to have any coingestions and his symptoms can certainly be explained by the med and phentermine. Patient will be continuously observed here in emergency department till he has a decrease in his blood pressure and his heart rate. Patient's received 2 doses of IV Ativan. Clinical Course     Assessment:  Med and phentermine adverse drug reaction   Final Clinical Impression:   Final diagnoses:  Drug overdose, undetermined intent, initial encounter     Plan:  Outpatient " New Prescriptions   LORAZEPAM (ATIVAN) 1 MG TABLET    Take 1 tablet (1 mg total) by mouth every 8 (eight) hours as needed for anxiety.  " Patient was advised to return immediately if condition worsens. Patient was advised to follow up with their primary care physician or other specialized physicians involved in their outpatient care. The patient and/or family member/power of attorney had laboratory results reviewed at the bedside. All questions and concerns were addressed and appropriate discharge instructions were distributed by the nursing staff.             Jennye MoccasinBrian S Trinidad Petron, MD 04/03/16 [phone]

## 2016-04-03 NOTE — ED Notes (Signed)
DC instructions discussed with patient. Pt states he is feeling better than he did when he got here but does not know what kind of drug that he took to make him feel this way. Pt states he is more relaxed now. Pt ambulated to the lobby without difficulty and given water to drink while he waits for a ride. Explained prescriptions to patient.

## 2016-04-03 NOTE — Discharge Instructions (Signed)
Please return immediately if condition worsens. Please contact her primary physician or the physician you were given for referral. If you have any specialist physicians involved in her treatment and plan please also contact them. Thank you for using [facility].  Please drink plenty of fluids and avoid any illicit substances.

## 2016-04-03 NOTE — ED Triage Notes (Signed)
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

## 2016-07-06 ENCOUNTER — Emergency Department: Payer: Self-pay

## 2016-07-06 ENCOUNTER — Emergency Department
Admission: EM | Admit: 2016-07-06 | Discharge: 2016-07-06 | Disposition: A | Discharge status: Home / Self Care | Payer: Self-pay | Attending: Emergency Medicine | Admitting: Emergency Medicine

## 2016-07-06 ENCOUNTER — Encounter: Payer: Self-pay | Admitting: Emergency Medicine

## 2016-07-06 DIAGNOSIS — Z139 Encounter for screening, unspecified: Secondary | ICD-10-CM

## 2016-07-06 DIAGNOSIS — Z79899 Other long term (current) drug therapy: Secondary | ICD-10-CM | POA: Insufficient documentation

## 2016-07-06 DIAGNOSIS — F4320 Adjustment disorder, unspecified: Secondary | ICD-10-CM | POA: Insufficient documentation

## 2016-07-06 DIAGNOSIS — Z0441 Encounter for examination and observation following alleged adult rape: Secondary | ICD-10-CM | POA: Insufficient documentation

## 2016-07-06 DIAGNOSIS — K644 Residual hemorrhoidal skin tags: Secondary | ICD-10-CM | POA: Insufficient documentation

## 2016-07-06 DIAGNOSIS — F17210 Nicotine dependence, cigarettes, uncomplicated: Secondary | ICD-10-CM | POA: Insufficient documentation

## 2016-07-06 DIAGNOSIS — K649 Unspecified hemorrhoids: Secondary | ICD-10-CM

## 2016-07-06 LAB — CBC
HEMATOCRIT: 47.7 % (ref 40.0–52.0)
HEMOGLOBIN: 16.8 g/dL (ref 13.0–18.0)
MCH: 30.8 pg (ref 26.0–34.0)
MCHC: 35.3 g/dL (ref 32.0–36.0)
MCV: 87.2 fL (ref 80.0–100.0)
Platelets: 147 10*3/uL — ABNORMAL LOW (ref 150–440)
RBC: 5.47 MIL/uL (ref 4.40–5.90)
RDW: 12.7 % (ref 11.5–14.5)
WBC: 8.7 10*3/uL (ref 3.8–10.6)

## 2016-07-06 LAB — URINE DRUG SCREEN, QUALITATIVE (ARMC ONLY)
Amphetamines, Ur Screen: POSITIVE — AB
BARBITURATES, UR SCREEN: NOT DETECTED
BENZODIAZEPINE, UR SCRN: NOT DETECTED
Barbiturates, Ur Screen: NOT DETECTED
Benzodiazepine, Ur Scrn: NOT DETECTED
CANNABINOID 50 NG, UR ~~LOC~~: NOT DETECTED
COCAINE METABOLITE, UR ~~LOC~~: NOT DETECTED
Cocaine Metabolite,Ur ~~LOC~~: NOT DETECTED
MDMA (ECSTASY) UR SCREEN: NOT DETECTED
MDMA (Ecstasy)Ur Screen: NOT DETECTED
METHADONE SCREEN, URINE: NOT DETECTED
METHADONE SCREEN, URINE: NOT DETECTED
OPIATE, UR SCREEN: NOT DETECTED
Opiate, Ur Screen: NOT DETECTED
Phencyclidine (PCP) Ur S: NOT DETECTED
Phencyclidine (PCP) Ur S: NOT DETECTED
TRICYCLIC, UR SCREEN: NOT DETECTED
TRICYCLIC, UR SCREEN: NOT DETECTED

## 2016-07-06 LAB — CBC WITH DIFFERENTIAL/PLATELET
BASOS ABS: 0.1 10*3/uL (ref 0–0.1)
BASOS PCT: 1 %
EOS ABS: 0.2 10*3/uL (ref 0–0.7)
Eosinophils Relative: 3 %
HCT: 50.5 % (ref 40.0–52.0)
Hemoglobin: 17.6 g/dL (ref 13.0–18.0)
Lymphocytes Relative: 29 %
Lymphs Abs: 2.1 10*3/uL (ref 1.0–3.6)
MCH: 30.8 pg (ref 26.0–34.0)
MCHC: 34.8 g/dL (ref 32.0–36.0)
MCV: 88.6 fL (ref 80.0–100.0)
MONO ABS: 0.6 10*3/uL (ref 0.2–1.0)
MONOS PCT: 8 %
NEUTROS ABS: 4.3 10*3/uL (ref 1.4–6.5)
Neutrophils Relative %: 59 %
PLATELETS: 163 10*3/uL (ref 150–440)
RBC: 5.7 MIL/uL (ref 4.40–5.90)
RDW: 12.8 % (ref 11.5–14.5)
WBC: 7.3 10*3/uL (ref 3.8–10.6)

## 2016-07-06 LAB — CHLAMYDIA/NGC RT PCR (ARMC ONLY)
CHLAMYDIA TR: DETECTED — AB
N GONORRHOEAE: NOT DETECTED

## 2016-07-06 LAB — COMPREHENSIVE METABOLIC PANEL
ALBUMIN: 4.6 g/dL (ref 3.5–5.0)
ALT: 22 U/L (ref 17–63)
ALT: 24 U/L (ref 17–63)
ANION GAP: 8 (ref 5–15)
AST: 24 U/L (ref 15–41)
AST: 36 U/L (ref 15–41)
Albumin: 4.8 g/dL (ref 3.5–5.0)
Alkaline Phosphatase: 55 U/L (ref 38–126)
Alkaline Phosphatase: 54 U/L (ref 38–126)
Anion gap: 7 (ref 5–15)
BILIRUBIN TOTAL: 0.6 mg/dL (ref 0.3–1.2)
BILIRUBIN TOTAL: 0.8 mg/dL (ref 0.3–1.2)
BUN: 18 mg/dL (ref 6–20)
BUN: 22 mg/dL — ABNORMAL HIGH (ref 6–20)
CHLORIDE: 108 mmol/L (ref 101–111)
CHLORIDE: 111 mmol/L (ref 101–111)
CO2: 24 mmol/L (ref 22–32)
CO2: 23 mmol/L (ref 22–32)
Calcium: 9.6 mg/dL (ref 8.9–10.3)
Calcium: 9.5 mg/dL (ref 8.9–10.3)
Creatinine, Ser: 1.14 mg/dL (ref 0.61–1.24)
Creatinine, Ser: 1.31 mg/dL — ABNORMAL HIGH (ref 0.61–1.24)
GFR calc Af Amer: >60 mL/min (ref >60)
GFR calc Af Amer: >60 mL/min (ref >60)
GFR calc non Af Amer: >60 mL/min (ref >60)
GLUCOSE: 103 mg/dL — AB (ref 65–99)
Glucose, Bld: 103 mg/dL — ABNORMAL HIGH (ref 65–99)
POTASSIUM: 4.6 mmol/L (ref 3.5–5.1)
POTASSIUM: 3.7 mmol/L (ref 3.5–5.1)
SODIUM: 140 mmol/L (ref 135–145)
Sodium: 141 mmol/L (ref 135–145)
TOTAL PROTEIN: 8 g/dL (ref 6.5–8.1)
TOTAL PROTEIN: 8 g/dL (ref 6.5–8.1)

## 2016-07-06 LAB — RAPID HIV SCREEN (HIV 1/2 AB+AG)
HIV 1/2 ANTIBODIES: NONREACTIVE
HIV-1 P24 Antigen - HIV24: NONREACTIVE

## 2016-07-06 LAB — ETHANOL
Alcohol, Ethyl (B): <5 mg/dL (ref <5)
Alcohol, Ethyl (B): <5 mg/dL

## 2016-07-06 LAB — POCT RAPID STREP A: Streptococcus, Group A Screen (Direct): NEGATIVE

## 2016-07-06 LAB — ACETAMINOPHEN LEVEL

## 2016-07-06 LAB — SALICYLATE LEVEL
Salicylate Lvl: <7 mg/dL (ref 2.8–30.0)
Salicylate Lvl: <7 mg/dL (ref 2.8–30.0)

## 2016-07-06 MED ORDER — IBUPROFEN 800 MG PO TABS
800.0000 mg | ORAL_TABLET | Freq: Once | ORAL | Status: AC
  Administered 2016-07-06: 800 mg via ORAL
  Filled 2016-07-06: qty 1

## 2016-07-06 MED ORDER — AZITHROMYCIN 1 G PO PACK
1.0000 g | PACK | Freq: Once | ORAL | Status: DC
  Filled 2016-07-06: qty 1

## 2016-07-06 MED ORDER — OXYCODONE-ACETAMINOPHEN 5-325 MG PO TABS: 1.0000 | ORAL_TABLET | Freq: Once | ORAL | Status: DC

## 2016-07-06 MED ORDER — AZITHROMYCIN 250 MG PO TABS
ORAL_TABLET | ORAL | Status: AC
  Administered 2016-07-06: 1000 mg via ORAL
  Filled 2016-07-06: qty 4

## 2016-07-06 MED ORDER — LORAZEPAM 1 MG PO TABS: 1.0000 mg | ORAL_TABLET | Freq: Once | ORAL | Status: DC

## 2016-07-06 MED ORDER — CEFTRIAXONE SODIUM 250 MG IJ SOLR
250.0000 mg | Freq: Once | INTRAMUSCULAR | Status: AC
  Administered 2016-07-06: 250 mg via INTRAMUSCULAR
  Filled 2016-07-06: qty 250

## 2016-07-06 MED ORDER — AZITHROMYCIN 500 MG PO TABS
1000.0000 mg | ORAL_TABLET | Freq: Once | ORAL | Status: AC
  Administered 2016-07-06: 1000 mg via ORAL

## 2016-07-06 MED ORDER — HYDROCORTISONE 2.5 % RE CREA: TOPICAL_CREAM | RECTAL | 0 refills | Status: AC

## 2016-07-06 MED ORDER — METRONIDAZOLE 500 MG PO TABS
2000.0000 mg | ORAL_TABLET | Freq: Once | ORAL | Status: AC
  Administered 2016-07-06: 2000 mg via ORAL

## 2016-07-06 MED ORDER — METRONIDAZOLE 500 MG PO TABS
ORAL_TABLET | ORAL | Status: AC
  Administered 2016-07-06: 2000 mg via ORAL
  Filled 2016-07-06: qty 4

## 2016-07-06 NOTE — ED Notes (Signed)
Signature pad not working.  Patient verbalized understanding of discharge instructions and has no further questions. 

## 2016-07-06 NOTE — ED Notes (Signed)
Pt denies HI/SI at this time  

## 2016-07-06 NOTE — Discharge Instructions (Signed)
You have been seen in the Emergency Department (ED)  today for psychiatric issues.  You have been evaluated by the behavioral medicine specialists and are being referred to: ° °[facility] °[address] °[place], [place] [address] °Phone:  [phone] or [phone] ° °Open Access:   °Walk-in ASSESSMENT hours, M-W-F, 8:00am - 3:00pm °Advanced Acess CRISIS:  M-F, 8:00am - 8:00pm °Outpatient Services Office Hours:  M-F, 8:00am - 5:00pm ° °Please return to the Emergency Department (ED)  immediately if you have ANY thoughts of hurting yourself or anyone else, so that we may help you. ° °Please avoid alcohol and drug use. ° °Follow up with your doctor and/or therapist as soon as possible regarding today's ED  visit.   Please follow up any other recommendations and clinic appointments provided by the psychiatry team that saw you in the Emergency Department. ° °

## 2016-07-06 NOTE — Discharge Instructions (Signed)
Take tylenol, motrin for pain.   You are treated for STDs.  You have up to 72 hrs to get evidence collection.   Stay safe.   After 72 hrs, you may try to apply anusol cream for hemorrhoids and rectal pain. If you want evidence collection, the cream may hinder the investigation   See your doctor  Return to ER if you have severe rectal pain, thoughts of harming yourself or others, rectal bleeding.

## 2016-07-06 NOTE — ED Notes (Signed)
Lab notified to inquire about urine drug screen, lab tech reported that it's in process and will result soon.

## 2016-07-06 NOTE — ED Notes (Signed)
Pt ambulatory in room and out to nurses station, requesting to use a phone and states he is just ready to go home.  Pt declines speaking with SANE RN, Audree CamelAmy Beard at this time.  Pt agrees to prophylactic STI medications; see MAR.

## 2016-07-06 NOTE — ED Notes (Signed)
Pt given meal tray and juice. 

## 2016-07-06 NOTE — ED Notes (Signed)
Pt's friend called and spoke with Diplomatic Services operational officersecretary, expresses concern that pt needs psych evaluation due to recent SI in the last few weeks.  Pt asked again by myself if having any thoughts of harming himself or others, pt response is "No."  Pt A/Ox4 without any neurological defecits.  MD aware.
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
## 2016-07-06 NOTE — ED Notes (Signed)
Pt given food tray per request.

## 2016-07-06 NOTE — ED Notes (Signed)
Pt denies IS/HI at this time.
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
## 2016-07-07 LAB — RPR: RPR Ser Ql: NONREACTIVE

## 2016-07-07 LAB — HIV ANTIBODY (ROUTINE TESTING W REFLEX): HIV Screen 4th Generation wRfx: NONREACTIVE
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
## 2016-07-08 ENCOUNTER — Telehealth: Payer: Self-pay | Admitting: Emergency Medicine

## 2016-07-08 NOTE — Telephone Encounter (Signed)
Called patient to inform of chlamydia test positive.  He was treated in the ED.  I told him about free treatment at [facility] for any partners.

## 2016-09-25 ENCOUNTER — Emergency Department
Admission: EM | Admit: 2016-09-25 | Discharge: 2016-09-27 | Disposition: A | Discharge status: Home / Self Care | Payer: Self-pay | Attending: Student in an Organized Health Care Education/Training Program | Admitting: Student in an Organized Health Care Education/Training Program

## 2016-09-25 DIAGNOSIS — F1510 Other stimulant abuse, uncomplicated: Secondary | ICD-10-CM

## 2016-09-25 DIAGNOSIS — F23 Brief psychotic disorder: Secondary | ICD-10-CM | POA: Insufficient documentation

## 2016-09-25 DIAGNOSIS — R451 Restlessness and agitation: Secondary | ICD-10-CM

## 2016-09-25 DIAGNOSIS — Z79899 Other long term (current) drug therapy: Secondary | ICD-10-CM | POA: Insufficient documentation

## 2016-09-25 DIAGNOSIS — F15950 Other stimulant use, unspecified with stimulant-induced psychotic disorder with delusions: Secondary | ICD-10-CM

## 2016-09-25 DIAGNOSIS — F17210 Nicotine dependence, cigarettes, uncomplicated: Secondary | ICD-10-CM | POA: Insufficient documentation

## 2016-09-25 DIAGNOSIS — F4325 Adjustment disorder with mixed disturbance of emotions and conduct: Secondary | ICD-10-CM | POA: Diagnosis present

## 2016-09-25 LAB — CBC WITH DIFFERENTIAL/PLATELET
BASOS ABS: 0.1 10*3/uL (ref 0–0.1)
Basophils Relative: 1 %
Eosinophils Absolute: 0.1 10*3/uL (ref 0–0.7)
Eosinophils Relative: 1 %
HEMATOCRIT: 41.6 % (ref 40.0–52.0)
Hemoglobin: 14.7 g/dL (ref 13.0–18.0)
LYMPHS ABS: 1.5 10*3/uL (ref 1.0–3.6)
Lymphocytes Relative: 14 %
MCH: 30.5 pg (ref 26.0–34.0)
MCHC: 35.3 g/dL (ref 32.0–36.0)
MCV: 86.5 fL (ref 80.0–100.0)
MONO ABS: 0.7 10*3/uL (ref 0.2–1.0)
Monocytes Relative: 7 %
NEUTROS ABS: 8.1 10*3/uL — AB (ref 1.4–6.5)
Neutrophils Relative %: 77 %
PLATELETS: 170 10*3/uL (ref 150–440)
RBC: 4.81 MIL/uL (ref 4.40–5.90)
RDW: 12.3 % (ref 11.5–14.5)
WBC: 10.4 10*3/uL (ref 3.8–10.6)

## 2016-09-25 LAB — URINE DRUG SCREEN, QUALITATIVE (ARMC ONLY)
Amphetamines, Ur Screen: POSITIVE — AB
BARBITURATES, UR SCREEN: NOT DETECTED
BENZODIAZEPINE, UR SCRN: POSITIVE — AB
CANNABINOID 50 NG, UR ~~LOC~~: NOT DETECTED
Cocaine Metabolite,Ur ~~LOC~~: NOT DETECTED
MDMA (Ecstasy)Ur Screen: NOT DETECTED
Methadone Scn, Ur: NOT DETECTED
Opiate, Ur Screen: NOT DETECTED
PHENCYCLIDINE (PCP) UR S: NOT DETECTED
TRICYCLIC, UR SCREEN: NOT DETECTED

## 2016-09-25 LAB — COMPREHENSIVE METABOLIC PANEL
ALT: 21 U/L (ref 17–63)
AST: 25 U/L (ref 15–41)
Albumin: 3.9 g/dL (ref 3.5–5.0)
Alkaline Phosphatase: 60 U/L (ref 38–126)
Anion gap: 7 (ref 5–15)
BILIRUBIN TOTAL: 0.6 mg/dL (ref 0.3–1.2)
BUN: 23 mg/dL — ABNORMAL HIGH (ref 6–20)
CHLORIDE: 111 mmol/L (ref 101–111)
CO2: 21 mmol/L — ABNORMAL LOW (ref 22–32)
Calcium: 8.9 mg/dL (ref 8.9–10.3)
Creatinine, Ser: 1.06 mg/dL (ref 0.61–1.24)
Glucose, Bld: 109 mg/dL — ABNORMAL HIGH (ref 65–99)
Potassium: 3.7 mmol/L (ref 3.5–5.1)
Sodium: 139 mmol/L (ref 135–145)
TOTAL PROTEIN: 7.1 g/dL (ref 6.5–8.1)

## 2016-09-25 LAB — TROPONIN I

## 2016-09-25 LAB — SALICYLATE LEVEL: Salicylate Lvl: <7 mg/dL (ref 2.8–30.0)

## 2016-09-25 LAB — ETHANOL

## 2016-09-25 LAB — ACETAMINOPHEN LEVEL

## 2016-09-25 MED ORDER — SODIUM CHLORIDE 0.9 % IV BOLUS (SEPSIS)
1000.0000 mL | Freq: Once | INTRAVENOUS | Status: AC
  Administered 2016-09-25: 1000 mL via INTRAVENOUS

## 2016-09-25 MED ORDER — ZIPRASIDONE MESYLATE 20 MG IM SOLR
20.0000 mg | Freq: Once | INTRAMUSCULAR | Status: AC
  Administered 2016-09-25: 20 mg via INTRAMUSCULAR

## 2016-09-25 MED ORDER — MIDAZOLAM HCL 2 MG/2ML IJ SOLN
1.0000 mg | INTRAMUSCULAR | Status: DC | PRN
  Administered 2016-09-25: 1 mg via INTRAVENOUS

## 2016-09-25 MED ORDER — MIDAZOLAM HCL 2 MG/2ML IJ SOLN
1.0000 mg | Freq: Once | INTRAMUSCULAR | Status: AC
  Administered 2016-09-25: 1 mg via INTRAVENOUS

## 2016-09-25 NOTE — ED Notes (Signed)
In and out cath for urine without difficulty - pt tolerated procedure well

## 2016-09-25 NOTE — ED Notes (Signed)
Pt is asleep - o2 sat 86% - placed on 2L o2 via n/c - o2 sat improved to 93% - Dr Roxan Hockeyobinson aware
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
## 2016-09-25 NOTE — BH Assessment (Signed)
First TTS consult attempted. Pt sleeping and unable to be aroused by clinician. Per pt RN Runell Gess(Teressa), pt presented with hallucinations, persecutory delusions, and aggressive behaviors. Pt administered sedatives.
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
## 2016-09-26 ENCOUNTER — Emergency Department: Payer: Self-pay

## 2016-09-26 MED ORDER — OLANZAPINE 5 MG PO TBDP
5.0000 mg | ORAL_TABLET | Freq: Two times a day (BID) | ORAL | Status: DC
  Administered 2016-09-26 – 2016-09-27 (×2): 5 mg via ORAL
  Filled 2016-09-26 (×4): qty 1

## 2016-09-26 MED ORDER — HALOPERIDOL LACTATE 5 MG/ML IJ SOLN
5.0000 mg | Freq: Once | INTRAMUSCULAR | Status: AC
  Administered 2016-09-26: 5 mg via INTRAMUSCULAR

## 2016-09-26 MED ORDER — LORAZEPAM 2 MG/ML IJ SOLN: 2.0000 mg | Freq: Once | INTRAMUSCULAR | Status: DC

## 2016-09-26 MED ORDER — LORAZEPAM 2 MG/ML IJ SOLN
INTRAMUSCULAR | Status: AC
  Administered 2016-09-26: 2 mg via INTRAMUSCULAR
  Filled 2016-09-26: qty 1

## 2016-09-26 MED ORDER — HALOPERIDOL LACTATE 5 MG/ML IJ SOLN
INTRAMUSCULAR | Status: AC
  Administered 2016-09-26: 5 mg via INTRAMUSCULAR
  Filled 2016-09-26: qty 1

## 2016-09-26 MED ORDER — LORAZEPAM 2 MG PO TABS
2.0000 mg | ORAL_TABLET | Freq: Once | ORAL | Status: AC
  Administered 2016-09-26: 2 mg via ORAL
  Filled 2016-09-26 (×2): qty 1

## 2016-09-26 MED ORDER — LORAZEPAM 2 MG/ML IJ SOLN
2.0000 mg | Freq: Once | INTRAMUSCULAR | Status: AC
  Administered 2016-09-26: 2 mg via INTRAMUSCULAR
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
## 2016-09-26 NOTE — ED Notes (Signed)
This RN attempting to change pt into behavioral scrubs and orient to plan of care for the last hour.  Pt becoming increasingly frustrated.  Pt denies any altercation with police, claims "you got the wrong man, I'm Fernando ParkinsJohn Phelps."  Pt had previously given this RN his full name and date of birth.
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
## 2016-09-26 NOTE — ED Notes (Signed)
PT IVC/ PENDING PLACEMENT  

## 2016-09-26 NOTE — ED Notes (Signed)
Pt very agitated at this time stating that someone is messing with him and want the [employer] called as well [place].

## 2016-09-26 NOTE — ED Notes (Signed)
Pt states that his right hand is numb and that his hands hurt. Pt states he was put in handcuffs by police and he doesn't know why. Pt has redness around both wrist. The skin is in intact and WDL with the exception of redness to area where handcuffs where placed.

## 2016-09-26 NOTE — ED Provider Notes (Signed)
-----------------------------------------   7:01 AM on 09/26/2016 -----------------------------------------   Blood pressure (!) 131/98, pulse 94, temperature 98.3 F (36.8 C), temperature source Oral, resp. rate 18, height 5\' 11"  (1.803 m), weight 190 lb (86.2 kg), SpO2 100 %.  The patient had no acute events since last update.  Calm and cooperative at this time.  Disposition is pending Psychiatry/Behavioral Medicine team recommendations.     Sharman CheekPhillip Inaaya Vellucci, MD 09/26/16 [phone]

## 2016-09-26 NOTE — ED Provider Notes (Signed)
Study Result   CLINICAL DATA:  Wrist pain following being handcuffed.  EXAM: RIGHT WRIST - COMPLETE 3+ VIEW  COMPARISON:  None.  FINDINGS: There is no evidence of fracture or dislocation. There is no evidence of arthropathy or other focal bone abnormality. Soft tissues are unremarkable.  IMPRESSION: No acute abnormality noted.   Electronically Signed   By: Alcide CleverMark  Lukens M.D.   On: 09/26/2016 08:20      Arnaldo NatalPaul F Esbeidy Mclaine, MD 09/26/16 [phone]

## 2016-09-26 NOTE — ED Notes (Addendum)
Pt changed into burgundy scrubs under direct supervision of this RN and Production manager[employer]er [employer] and [employer].  2 bags of clothes d/t to furry coat.  Prior to being changed into hospital attire, pt looked at his clothes and said: "I look like fucking Amie Portlandonald McDonald! Why did y'all dress me like this?"
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
## 2016-09-26 NOTE — ED Notes (Signed)

## 2016-09-26 NOTE — ED Notes (Signed)
This rn will not be assuming care of this pt at this time.

## 2016-09-26 NOTE — ED Notes (Signed)
Pt sleeping. 

## 2016-09-26 NOTE — ED Notes (Signed)
Pt given warm blanket, said thank you and rolled over and back to sleep
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
## 2016-09-26 NOTE — ED Provider Notes (Signed)
Patient took the Ativan by mouth after more convincing. He hasn't seemed to calm down but then became more agitated and began yelling again and we'll went into the room to officers myself 2 nurses and tach and he calmed down again and took 2 mg Ativan IM and 5 Haldol IM as well.   Arnaldo NatalPaul F Malinda, MD 09/26/16 [phone]

## 2016-09-26 NOTE — ED Notes (Signed)
Report to Ann, RN

## 2016-09-26 NOTE — ED Notes (Signed)
Pt given lunch tray.
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
## 2016-09-26 NOTE — ED Notes (Signed)
Asked pt if he wanted to eat - pt grumbled and rolled over.

## 2016-09-26 NOTE — ED Notes (Signed)
Pt removed his arm band prior to this RN's shift. New arm band printed and placed on Pt's arm. Pt ripped the arm band off and dropped on the floor. Pt's arm band placed with ED Tech on the rolling computer.

## 2016-09-26 NOTE — ED Notes (Signed)
Pt will not keep his ID bracelet on. States that is not him and by wearing it he is saying that he is Fernando Phelps. Pt also removed his IV. Pt given 2x2 to apply pressure to the area.

## 2016-09-26 NOTE — ED Notes (Signed)
Pt continues to state his right arm is numb. Pt removed IV. Pt given gauze and held pressure.

## 2016-09-26 NOTE — ED Provider Notes (Signed)
Patient is still getting anxious and has been doing the behavior noted in the nurse's notes we will try to see if he can take 2 mg Ativan by mouth to help him calm down just a little bit.   Fernando NatalPaul F Noelie Renfrow, MD 09/26/16 [phone]

## 2016-09-26 NOTE — ED Notes (Signed)
Pt given breakfast tray

## 2016-09-26 NOTE — ED Notes (Signed)
Report from noel, rn.  

## 2016-09-26 NOTE — ED Notes (Signed)
Pt still sleeping after being medicated earlier today. Was arousable and did not want to eat at this time. Food placed in room for later

## 2016-09-27 DIAGNOSIS — F1510 Other stimulant abuse, uncomplicated: Secondary | ICD-10-CM

## 2016-09-27 DIAGNOSIS — F15950 Other stimulant use, unspecified with stimulant-induced psychotic disorder with delusions: Secondary | ICD-10-CM

## 2016-09-27 NOTE — ED Notes (Signed)
Pt currently calling to request rides home.

## 2016-09-27 NOTE — ED Notes (Signed)
BEHAVIORAL HEALTH ROUNDING Patient sleeping: Yes.   Patient alert and oriented: not applicable SLEEPING Behavior appropriate: Yes.  ; If no, describe: SLEEPING Nutrition and fluids offered: No SLEEPING Toileting and hygiene offered: NoSLEEPING Sitter present: not applicable, Q 15 min safety rounds and observation. Law enforcement present: Yes ODS 

## 2016-09-27 NOTE — ED Notes (Signed)
MD Clapacs at bedside  

## 2016-09-27 NOTE — ED Provider Notes (Signed)
-----------------------------------------   7:50 AM on 09/27/2016 -----------------------------------------   Blood pressure 107/65, pulse 63, temperature 97.6 F (36.4 C), temperature source Oral, resp. rate 18, height 5\' 11"  (1.803 m), weight 190 lb (86.2 kg), SpO2 99 %.  The patient had no acute events since last update.  Calm and cooperative at this time.  Disposition is pending Psychiatry/Behavioral Medicine team recommendations.     Merrily BrittleNeil Garth Diffley, MD 09/27/16 [phone]

## 2016-09-27 NOTE — ED Notes (Signed)

## 2016-09-27 NOTE — Discharge Instructions (Signed)
Return for any concerns
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
## 2016-09-27 NOTE — ED Notes (Signed)
Pt attempted to call rides without success. Pt has all numbers on a post it note and has been given food and a drink. Pt ambulatory to the lobby and instructed he can wait in lobby until a friend is reached. Pt verbalized understanding. Blanket given to pt since cloths were cut.

## 2016-10-03 DIAGNOSIS — F17210 Nicotine dependence, cigarettes, uncomplicated: Secondary | ICD-10-CM | POA: Insufficient documentation

## 2016-10-03 DIAGNOSIS — M795 Residual foreign body in soft tissue: Secondary | ICD-10-CM | POA: Insufficient documentation

## 2016-10-03 DIAGNOSIS — T754XXA Electrocution, initial encounter: Secondary | ICD-10-CM | POA: Insufficient documentation
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
## 2016-10-04 ENCOUNTER — Emergency Department: Payer: No Typology Code available for payment source

## 2016-10-04 ENCOUNTER — Emergency Department
Admission: EM | Admit: 2016-10-04 | Discharge: 2016-10-04 | Disposition: A | Discharge status: Home / Self Care | Payer: No Typology Code available for payment source | Attending: Student in an Organized Health Care Education/Training Program | Admitting: Student in an Organized Health Care Education/Training Program

## 2016-10-04 DIAGNOSIS — W868XXA Exposure to other electric current, initial encounter: Secondary | ICD-10-CM

## 2016-10-04 DIAGNOSIS — M795 Residual foreign body in soft tissue: Secondary | ICD-10-CM

## 2016-10-04 DIAGNOSIS — T754XXA Electrocution, initial encounter: Secondary | ICD-10-CM

## 2016-10-04 MED ORDER — LIDOCAINE HCL (PF) 1 % IJ SOLN
5.0000 mL | Freq: Once | INTRAMUSCULAR | Status: DC
  Filled 2016-10-04: qty 5
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
## 2016-11-24 DIAGNOSIS — R109 Unspecified abdominal pain: Secondary | ICD-10-CM

## 2016-11-24 NOTE — ED Notes (Signed)
Pt refusing to give UA sample without PO intake, patient aware of NPO status d/t nature of chief complaint. Pt continues to want to sleep.

## 2016-11-24 NOTE — Discharge Instructions (Signed)
Thanks for coming to the [facility] today. You were seen and evaluated for abdominal pain. Please follow the below recommendations:    - Your exam, blood work and urine did not reveal an evident cause of your abdominal pain.    -  Although the specific cause of your abdominal pain is not certain, it does not appear to be caused by a process that would require a surgery or antibiotics. That being said, sometimes serious causes of pain can present in unusual ways or can take time to develop.     - Please return to the ED if the pain changes - gets worse, spreads to other parts of your abdomen or if you have other symptoms such as fevers, nausea, vomiting or you feel worse in any way. Otherwise, it is very important to be rechecked by the follow-up provider as we discussed.        Thank you.  Kassi Esteve, MD

## 2016-11-24 NOTE — ED Provider Notes (Signed)
I saw this patient in my role as the ED provider in triage. As such I did a limited history and physical exam to confirm the patient's triage level and order appropriate studies (if any) to begin the patient's workup. The patient understands that a medically appropriate evaluation will be done when they're brought back to an ED room.         Dakota Pham [place], [place]  11/24/16 1816

## 2016-11-24 NOTE — ED Provider Notes (Signed)
[facility] EMERGENCY DEPARTMENT PROVIDER NOTE      Name: Dakota Pham  DOB: [birth year] 31 y.o.  MRN: [id number]  CSN: [id number]    HISTORY:     Dakota Pham is a 31 y.o. male who presents with abdominal pain.    The patient reports that he was otherwise at his normal baseline when he was walking down the highway today. At that time he began having abdominal pain. It was diffuse in nature. It caused him to clutch his abdomen and lay down. He had no associated nausea or vomiting. He denies fevers or chills. He has had no diarrhea or constipation. He denies urinary symptoms. He states that he has been eating and drinking okay. He had fruits and nuts earlier today.    Otherwise reports being at their normal baseline with no recent fevers, chills, coughs or colds.      Past Medical History  Polysubstance abuse, last methamphetamine use one week ago    Past Surgical History  Left knee surgery    Medications  None    Allergies  NKDA    Social History  Tobacco: Yes  Alcohol: Rare  Drugs: Reports using all drugs at some point in time, last methamphetamine use one week ago  Currently homeless    Family History  Reviewed with patient and felt non-contributory to current presentation.    Review of Systems  10-pt Review of Systems was performed and is otherwise negative apart from that mentioned in HPI.    PHYSICAL EXAM:   VITAL SIGNS:  BP (!) 116/59 (BP Location: Right arm, Patient Position: Lying)   Pulse 84   Temp 36.9 ?C (98.5 ?F) (Oral)   Resp 18   SpO2 95%   Constitutional:  Well developed, Well nourished, No acute distress, Non-toxic appearance.   HENT:  Normocephalic, atraumatic. Mucous membranes tacky.    Eyes:  Conjunctiva normal, No discharge.   Respiratory:  Normal work of breathing. Normal breath sounds throughout. No wheezing, rhonchi or rales.   Cardiovascular:  Regular rate and rhythm. No murmur, rubs, or gallop. No peripheral edema.   GI:  Abdomen is soft and non-distended. Mild diffuse abdominal  tenderness palpation. No rebound. No guarding.  Musculoskeletal:  Intact distal pulses. No edema. No tenderness.   Integument:  Warm. Dry. No erythema. No rash.   Neurologic:  Alert & oriented x 3,.  Psychiatric:  Affect normal. Mood normal.       ED COURSE & MEDICAL DECISION MAKING    In summary, Dakota Pham is a 31 y.o. male who presents with abdominal pain.    Our primary and secondary assessment reveals an awake, alert patient in no acute distress. Hemodynamically stable and afebrile. Exam reveals mild diffuse abdominal tenderness palpation. Overall benign abdominal exam.     Given the patient's history and exam, we elected proceed with labs. He has no fever, leukocytosis or otherwise to be suggestive of an overwhelming infectious process. He has normal liver enzymes and no focal right upper quadrant tenderness to palpation to be suggestive of an acute hepatobiliary source of his symptoms. His lipase is negative making pancreatitis seem unlikely. At this time, there does not appear to be an acute emergent cause of his abdominal pain that would require further ED diagnostics or hospital observation. I think his encounter may be precipitated by his homeless status.    Given the patient's workup, feel he is safe for discharge. Have discussed with the patient results of workup, indications for return and  need for PCP follow up. They understand and agree with the plan.       Labs:  CBC unremarkable  CMP unremarkable  Lipase negative      IMPRESSION:  Abdominal pain      PLAN, DISPOSITION AND FOLLOW-UP:   Discharged in stable condition  Follow-up PCP  Return precautions given         Loma Messing, MD  11/24/16 2200

## 2016-11-24 NOTE — ED Notes (Addendum)
Urinal provided to pt
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
## 2016-11-24 NOTE — ED Notes (Signed)
Pt resting in bed with eyes closed, warm blanket provided, pt states he is tired and that his pain has decreased to a 3/10

## 2016-11-25 ENCOUNTER — Inpatient Hospital Stay: Admit: 2016-11-25 | Discharge: 2016-11-25 | Disposition: A | Discharge status: Home / Self Care | Attending: MD

## 2016-11-25 LAB — COMPREHENSIVE METABOLIC PANEL
ALT - Alanine Aminotransferase: 46 IU/L (ref 7–52)
AST - Aspartate Aminotransferase: 54 IU/L — ABNORMAL HIGH (ref 10–50)
Albumin/Globulin Ratio: 1.5 (ref >0.9)
Albumin: 4.2 g/dL (ref 3.5–5.0)
Alkaline Phosphatase: 69 IU/L (ref 34–104)
Anion Gap: 8 mmol/L (ref 4–13)
BUN: 19 mg/dL (ref 6–23)
Bilirubin Total: 0.4 mg/dL (ref 0.3–1.2)
CO2 - Carbon Dioxide: 26 mmol/L (ref 21–31)
Calcium: 9.1 mg/dL (ref 8.6–10.3)
Chloride: 106 mmol/L (ref 98–111)
Creatinine: 0.96 mg/dL (ref 0.65–1.30)
GFR Estimate: >60 mL/min/{1.73_m2} (ref >=60)
Globulin: 2.8 g/dL (ref 2.2–3.7)
Glucose: 108 mg/dL — ABNORMAL HIGH (ref 80–99)
Potassium: 4.1 mmol/L (ref 3.5–5.1)
Protein Total: 7 g/dL (ref 6.0–8.0)
Sodium: 140 mmol/L (ref 135–143)

## 2016-11-25 LAB — CBC WITH AUTO DIFFERENTIAL
Basophils %: 0 % (ref 0–2)
Basophils, Absolute: 0 10*3/ÂµL (ref 0.0–0.2)
Eosinophils %: 5 % (ref 0–7)
Eosinophils, Absolute: 0.5 10*3/ÂµL (ref 0.0–0.7)
HCT: 43.9 % (ref 42.0–54.0)
Hemoglobin: 14.8 g/dL (ref 12.0–18.0)
Lymphocytes %: 23 % — ABNORMAL LOW (ref 25–45)
Lymphocytes, Absolute: 2.3 10*3/??L (ref 1.1–4.3)
MCH: 30.4 pg (ref 27.0–34.0)
MCHC: 33.7 g/dL (ref 32.0–36.0)
MCV: 90.2 fL (ref 81.0–99.0)
MPV: 9.5 fL (ref 7.4–10.4)
Monocytes %: 7 % (ref 0–12)
Monocytes, Absolute: 0.7 10*3/??L (ref 0.0–1.2)
Neutrophils %: 65 % (ref 35–70)
Neutrophils, Absolute: 6.5 10*3/??L (ref 1.6–7.3)
Platelet Count: 159 10*3/ÂµL (ref 150–400)
RBC: 4.86 10*6/ÂµL (ref 4.70–6.10)
RDW: 13.5 % (ref 11.5–14.5)
WBC: 10 10*3/ÂµL (ref 4.8–10.8)

## 2016-11-25 LAB — LIPASE: Lipase: 33 IU/L (ref 10–60)
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
## 2019-10-17 ENCOUNTER — Encounter (HOSPITAL_BASED_OUTPATIENT_CLINIC_OR_DEPARTMENT_OTHER): Payer: Self-pay

## 2019-10-17 ENCOUNTER — Ambulatory Visit
Admission: RE | Admit: 2019-10-17 | Discharge: 2019-10-17 | Disposition: A | Discharge status: Home / Self Care | Payer: 59 | Attending: Ophthalmology | Admitting: Ophthalmology

## 2019-10-17 DIAGNOSIS — T1502XD Foreign body in cornea, left eye, subsequent encounter: Secondary | ICD-10-CM | POA: Insufficient documentation

## 2019-10-17 DIAGNOSIS — H539 Unspecified visual disturbance: Secondary | ICD-10-CM | POA: Insufficient documentation

## 2019-10-17 MED ORDER — ERYTHROMYCIN 5 MG/GM OP OINT
TOPICAL_OINTMENT | Freq: Every evening | OPHTHALMIC | 0 refills | Status: AC
Start: 2019-10-17 — End: 2019-10-24

## 2019-10-17 MED ORDER — CARBOXYMETHYLCELLULOSE SODIUM 0.5 % OP SOLN
1.0000 [drp] | Freq: Four times a day (QID) | OPHTHALMIC | 3 refills | Status: AC
Start: 2019-10-17 — End: ?
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
## 2019-10-17 NOTE — Progress Notes (Signed)
I was not asked to see the patient, but have reviewed the note and agree with the assessment and plan.

## 2021-04-02 ENCOUNTER — Telehealth (HOSPITAL_BASED_OUTPATIENT_CLINIC_OR_DEPARTMENT_OTHER): Payer: Self-pay | Admitting: Student in an Organized Health Care Education/Training Program

## 2021-04-02 NOTE — Telephone Encounter (Signed)
RETURN CALL: Voicemail - Detailed Message      SUBJECT:  General Message     MESSAGE: Patient would like a copy of his prescription for glasses mailed to him as he lost his last copy.  Thank you
